# Patient Record
Sex: Male | Born: 1980 | Race: White | Hispanic: No | Marital: Married | Smoking: Current every day smoker
Health system: Southern US, Community
[De-identification: ages and names within clinical notes are randomized; demographics above are authoritative.]

## PROBLEM LIST (undated history)

## (undated) DIAGNOSIS — I10 Essential (primary) hypertension: Secondary | ICD-10-CM

## (undated) DIAGNOSIS — L659 Nonscarring hair loss, unspecified: Secondary | ICD-10-CM

## (undated) HISTORY — DX: Essential (primary) hypertension: I10

## (undated) HISTORY — PX: FINGER SURGERY: SHX640

---

## 2007-03-02 DIAGNOSIS — L659 Nonscarring hair loss, unspecified: Secondary | ICD-10-CM

## 2007-03-02 HISTORY — DX: Nonscarring hair loss, unspecified: L65.9

## 2009-06-05 DIAGNOSIS — Z801 Family history of malignant neoplasm of trachea, bronchus and lung: Secondary | ICD-10-CM | POA: Insufficient documentation

## 2010-02-05 DIAGNOSIS — K219 Gastro-esophageal reflux disease without esophagitis: Secondary | ICD-10-CM | POA: Insufficient documentation

## 2010-08-06 DIAGNOSIS — L659 Nonscarring hair loss, unspecified: Secondary | ICD-10-CM | POA: Insufficient documentation

## 2017-12-27 ENCOUNTER — Ambulatory Visit
Admission: EM | Admit: 2017-12-27 | Discharge: 2017-12-27 | Disposition: A | Discharge status: Home / Self Care | Payer: Self-pay | Attending: Family Medicine | Admitting: Family Medicine

## 2017-12-27 ENCOUNTER — Other Ambulatory Visit: Payer: Self-pay

## 2017-12-27 ENCOUNTER — Ambulatory Visit (INDEPENDENT_AMBULATORY_CARE_PROVIDER_SITE_OTHER): Payer: Self-pay

## 2017-12-27 DIAGNOSIS — S61309A Unspecified open wound of unspecified finger with damage to nail, initial encounter: Secondary | ICD-10-CM

## 2017-12-27 DIAGNOSIS — S61314A Laceration without foreign body of right ring finger with damage to nail, initial encounter: Secondary | ICD-10-CM

## 2017-12-27 DIAGNOSIS — S61319A Laceration without foreign body of unspecified finger with damage to nail, initial encounter: Secondary | ICD-10-CM

## 2017-12-27 DIAGNOSIS — W230XXA Caught, crushed, jammed, or pinched between moving objects, initial encounter: Secondary | ICD-10-CM

## 2017-12-27 DIAGNOSIS — S61304A Unspecified open wound of right ring finger with damage to nail, initial encounter: Secondary | ICD-10-CM

## 2017-12-27 DIAGNOSIS — Z23 Encounter for immunization: Secondary | ICD-10-CM

## 2017-12-27 HISTORY — DX: Nonscarring hair loss, unspecified: L65.9

## 2017-12-27 MED ORDER — DOXYCYCLINE HYCLATE 100 MG PO CAPS: 100.0000 mg | ORAL_CAPSULE | Freq: Two times a day (BID) | ORAL | 0 refills | Status: DC

## 2017-12-27 MED ORDER — TETANUS-DIPHTH-ACELL PERTUSSIS 5-2.5-18.5 LF-MCG/0.5 IM SUSP
0.5000 mL | Freq: Once | INTRAMUSCULAR | Status: AC
  Administered 2017-12-27: 0.5 mL via INTRAMUSCULAR

## 2017-12-27 NOTE — Discharge Instructions (Addendum)
Elevate your hand above the level of your heart most of today and tomorrow to prevent swelling and throbbing.  Keep dry for 24 hours and then you may start a washing program.  Be sure to take all of the antibiotic medications as prescribed.  For pain I recommend Tylenol 500 mg combined with ibuprofen 400 mg every 6 hours.  Return to our clinic in 2 days for follow-up examination.  Because of the injury to the nailbed your nail may not regrow normally.  May take several months for the nail completely regrow.

## 2017-12-27 NOTE — ED Triage Notes (Signed)
Patient complains of right hand ring finger. Patient states that he smashed his finger in between a sliding glass door. Patient states that this occurred around 1 hour ago and has continued to bleed.

## 2017-12-27 NOTE — ED Provider Notes (Signed)
[facility]    CSN: [id number] Arrival date & time: 12/27/17  4098     History   Chief Complaint Chief Complaint  Patient presents with  . Laceration    HPI Rodney Mccoy is a 37 y.o. male.   HPI  37 year old male is with a right dominant hand ring finger crush injury to the distal tip of his finger.  He states he got his finger caught between 2 pains of a sliding glass door.  Approximately 1 hour ago.  Has an injury to the nail itself.        Past Medical History:  Diagnosis Date  . Hair loss     There are no active problems to display for this patient.   Past Surgical History:  Procedure Laterality Date  . FINGER SURGERY         Home Medications    Prior to Admission medications   Medication Sig Start Date End Date Taking? Authorizing Provider  Ascorbic Acid (VITAMIN C) 500 MG CAPS Take by mouth.   Yes [provider]  finasteride (PROSCAR) 5 MG tablet Take by mouth. 05/03/17  Yes [provider]  doxycycline (VIBRAMYCIN) 100 MG capsule Take 1 capsule (100 mg total) by mouth 2 (two) times daily. 12/27/17   Lutricia Feil, PA-C    Family History Family History  Problem Relation Age of Onset  . Hypertension Mother     Social History Social History   Tobacco Use  . Smoking status: Current Every Day Smoker    Packs/day: 0.50    Types: Cigarettes  . Smokeless tobacco: Never Used  Substance Use Topics  . Alcohol use: Yes    Comment: daily  . Drug use: Not Currently     Allergies   Patient has no known allergies.   Review of Systems Review of Systems  Constitutional: Positive for activity change. Negative for appetite change, chills, diaphoresis, fatigue and fever.  Skin: Positive for wound.  All other systems reviewed and are negative.    Physical Exam Triage Vital Signs ED Triage Vitals  Enc Vitals Group     BP 12/27/17 0853 (!) 140/92     Pulse Rate 12/27/17 0853 75     Resp 12/27/17 0853 18   Temp 12/27/17 0853 98.2 F (36.8 C)     Temp Source 12/27/17 0853 Oral     SpO2 12/27/17 0853 100 %     Weight 12/27/17 0850 150 lb (68 kg)     Height 12/27/17 0850 5\' 9"  (1.753 m)     Head Circumference --      Peak Flow --      Pain Score 12/27/17 0850 4     Pain Loc --      Pain Edu? --      Excl. in GC? --    No data found.  Updated Vital Signs BP (!) 140/92 (BP Location: Left Arm)   Pulse 75   Temp 98.2 F (36.8 C) (Oral)   Resp 18   Ht 5\' 9"  (1.753 m)   Wt 150 lb (68 kg)   SpO2 100%   BMI 22.15 kg/m   Visual Acuity Right Eye Distance:   Left Eye Distance:   Bilateral Distance:    Right Eye Near:   Left Eye Near:    Bilateral Near:     Physical Exam  Constitutional: He is oriented to person, place, and time. He appears well-developed and well-nourished. No distress.  HENT:  Head:  Normocephalic.  Eyes: Pupils are equal, round, and reactive to light. Right eye exhibits no discharge. Left eye exhibits no discharge.  Neck: Normal range of motion.  Musculoskeletal: Normal range of motion. He exhibits tenderness and deformity.   Exam Of the right dominant fourth finger laceration of the nail.  No deformity of the finger however the nail is deformed. He has numbness  of the distal tuft.  Extensor tendon is intact and strong to clinical testing by extending the finger and holding against resistance.  No other fingers appear to be involved.  Neurological: He is alert and oriented to person, place, and time.  Skin: Skin is warm and dry. He is not diaphoretic.  Psychiatric: He has a normal mood and affect. His behavior is normal. Thought content normal.  Nursing note and vitals reviewed.        UC Treatments / Results  Labs (all labs ordered are listed, but only abnormal results are displayed) Labs Reviewed - No data to display  EKG None  Radiology Dg Finger Ring Right  Result Date: 12/27/2017 CLINICAL DATA:  Crush injury to tip of right ring finger EXAM:  RIGHT RING FINGER 2+V COMPARISON:  None. FINDINGS: There is no evidence of fracture or dislocation. There is no evidence of arthropathy or other focal bone abnormality. Soft tissues are unremarkable. IMPRESSION: Negative. Electronically Signed   By: Charlett Nose M.D.   On: 12/27/2017 09:42    Procedures Laceration Repair Date/Time: 12/27/2017 11:07 AM Performed by: Lutricia Feil, PA-C Authorized by: Tommie Sams, DO   Consent:    Consent obtained:  Verbal   Consent given by:  Patient   Risks discussed:  Infection, poor cosmetic result and need for additional repair   Alternatives discussed:  Referral Anesthesia (see MAR for exact dosages):    Anesthesia method:  Nerve block   Block needle gauge:  27 G   Block anesthetic:  Lidocaine 1% w/o epi   Block technique:  Digital   Block injection procedure:  Anatomic landmarks identified, introduced needle, negative aspiration for blood and incremental injection   Block outcome:  Anesthesia achieved Laceration details:    Location:  Finger   Finger location:  L ring finger   Length (cm):  2   Depth (mm):  3 Repair type:    Repair type:  Intermediate Pre-procedure details:    Preparation:  Patient was prepped and draped in usual sterile fashion Exploration:    Hemostasis achieved with:  Direct pressure   Wound exploration: entire depth of wound probed and visualized     Contaminated: no   Treatment:    Area cleansed with:  Shur-Clens   Amount of cleaning:  Standard   Irrigation solution:  Sterile saline   Irrigation volume:  60   Irrigation method:  Pressure wash   Visualized foreign bodies/material removed: no   Skin repair:    Repair method:  Sutures and tissue adhesive   Suture size:  5-0   Suture material:  Fast-absorbing gut   Suture technique:  Simple interrupted   Number of sutures:  6 Approximation:    Approximation:  Close Post-procedure details:    Dressing:  Splint for protection and sterile dressing   Patient  tolerance of procedure:  Tolerated well, no immediate complications Comments:     Patient had completely avulsed his nail.  No remnant of nail remained.  Totally lost.  Laceration of the bed is V-shaped partial loss of the ulnar aspect proximally.  Will likely deform the regrowth of the nail.  He was fully advised of this.   fast-absorbing Vicryl was utilized to reapproximate the nailbed laceration.  The bed was then covered with Dermabond protection.  Dry sterile dressing was then applied with nonadherent gauze.  Patient was provided with a stack splint for protection. To be followed in 2 days for wound reevaluation.  Was given doxycycline 5 days a twice daily dosage   (including critical care time)  Medications Ordered in UC Medications  Tdap (BOOSTRIX) injection 0.5 mL (0.5 mLs Intramuscular Given 12/27/17 1103)    Initial Impression / Assessment and Plan / UC Course  I have reviewed the triage vital signs and the nursing notes.  Pertinent labs & imaging results that were available during my care of the patient were reviewed by me and considered in my medical decision making (see chart for details).   Elevate your hand above the level of your heart most of today and tomorrow to prevent swelling and throbbing.  Keep dry for 24 hours and then you may start a washing program.  Be sure to take all of the antibiotic medications as prescribed.  For pain I recommend Tylenol 500 mg combined with ibuprofen 400 mg every 6 hours.  Return to our clinic in 2 days for follow-up examination.  Because of the injury to the nailbed your nail may not regrow normally.  May take several months for the nail completely regrow.     Final Clinical Impressions(s) / UC Diagnoses   Final diagnoses:  Fingernail avulsion, complete, initial encounter  Laceration of nail bed of finger, initial encounter     Discharge Instructions     Elevate your hand above the level of your heart most of today and tomorrow to  prevent swelling and throbbing.  Keep dry for 24 hours and then you may start a washing program.  Be sure to take all of the antibiotic medications as prescribed.  For pain I recommend Tylenol 500 mg combined with ibuprofen 400 mg every 6 hours.  Return to our clinic in 2 days for follow-up examination.  Because of the injury to the nailbed your nail may not regrow normally.  May take several months for the nail completely regrow.    ED Prescriptions    Medication Sig Dispense Auth. Provider   doxycycline (VIBRAMYCIN) 100 MG capsule Take 1 capsule (100 mg total) by mouth 2 (two) times daily. 10 capsule Lutricia Feil, PA-C     Controlled Substance Prescriptions [place] Controlled Substance Registry consulted? Not Applicable   Lutricia Feil, PA-C 12/27/17 1120

## 2017-12-29 ENCOUNTER — Other Ambulatory Visit: Payer: Self-pay

## 2017-12-29 ENCOUNTER — Ambulatory Visit
Admission: EM | Admit: 2017-12-29 | Discharge: 2017-12-29 | Disposition: A | Discharge status: Home / Self Care | Payer: Self-pay | Attending: Family Medicine | Admitting: Family Medicine

## 2017-12-29 DIAGNOSIS — S6710XD Crushing injury of unspecified finger(s), subsequent encounter: Secondary | ICD-10-CM

## 2017-12-29 DIAGNOSIS — S61319D Laceration without foreign body of unspecified finger with damage to nail, subsequent encounter: Secondary | ICD-10-CM

## 2017-12-29 MED ORDER — MUPIROCIN 2 % EX OINT: 1.0000 "application " | TOPICAL_OINTMENT | Freq: Three times a day (TID) | CUTANEOUS | 0 refills | Status: DC

## 2017-12-29 NOTE — ED Triage Notes (Signed)
Patient is here for recheck of fingernail avulsion repair, reports that he has had no issues.

## 2017-12-29 NOTE — Discharge Instructions (Addendum)
1 week after the suturing , start applying Bactroban ointment (mupirocin) 3 times daily until the finger has healed.   If there are any changes that you are unsure of , please return to our clinic for evaluation.

## 2017-12-29 NOTE — ED Provider Notes (Signed)
[facility]    CSN: [id number] Arrival date & time: 12/29/17  4098     History   Chief Complaint Chief Complaint  Patient presents with  . Follow-up  . Finger Injury    HPI Rodney Mccoy is a 37 y.o. male.   HPI  37 year old male presents after sustaining a crush injury to his right dominant ring finger tip with avulsion of the nail and laceration of the nailbed.  He states that he is doing well.  He has had no problems with the repair.  He found the intact nail that had been avulsed on the floor of the patio.         Past Medical History:  Diagnosis Date  . Hair loss     There are no active problems to display for this patient.   Past Surgical History:  Procedure Laterality Date  . FINGER SURGERY         Home Medications    Prior to Admission medications   Medication Sig Start Date End Date Taking? Authorizing Provider  Ascorbic Acid (VITAMIN C) 500 MG CAPS Take by mouth.   Yes [provider]  doxycycline (VIBRAMYCIN) 100 MG capsule Take 1 capsule (100 mg total) by mouth 2 (two) times daily. 12/27/17  Yes Lutricia Feil, PA-C  finasteride (PROSCAR) 5 MG tablet Take by mouth. 05/03/17  Yes [provider]  mupirocin ointment (BACTROBAN) 2 % Apply 1 application topically 3 (three) times daily. 12/29/17   Lutricia Feil, PA-C    Family History Family History  Problem Relation Age of Onset  . Hypertension Mother     Social History Social History   Tobacco Use  . Smoking status: Current Every Day Smoker    Packs/day: 0.50    Types: Cigarettes  . Smokeless tobacco: Never Used  Substance Use Topics  . Alcohol use: Yes    Comment: daily  . Drug use: Not Currently     Allergies   Patient has no known allergies.   Review of Systems Review of Systems  Constitutional: Positive for activity change. Negative for appetite change, chills, fatigue and fever.  All other systems reviewed and are  negative.    Physical Exam Triage Vital Signs ED Triage Vitals  Enc Vitals Group     BP 12/29/17 0926 (!) 135/98     Pulse Rate 12/29/17 0926 76     Resp 12/29/17 0926 18     Temp 12/29/17 0926 98.1 F (36.7 C)     Temp Source 12/29/17 0926 Oral     SpO2 12/29/17 0926 100 %     Weight 12/29/17 0925 150 lb (68 kg)     Height 12/29/17 0925 5\' 9"  (1.753 m)     Head Circumference --      Peak Flow --      Pain Score 12/29/17 0925 2     Pain Loc --      Pain Edu? --      Excl. in GC? --    No data found.  Updated Vital Signs BP (!) 135/98 (BP Location: Left Arm)   Pulse 76   Temp 98.1 F (36.7 C) (Oral)   Resp 18   Ht 5\' 9"  (1.753 m)   Wt 150 lb (68 kg)   SpO2 100%   BMI 22.15 kg/m   Visual Acuity Right Eye Distance:   Left Eye Distance:   Bilateral Distance:    Right Eye Near:   Left Eye  Near:    Bilateral Near:     Physical Exam  Constitutional: He is oriented to person, place, and time. He appears well-developed and well-nourished. No distress.  HENT:  Head: Normocephalic.  Neck: Normal range of motion.  Musculoskeletal: Normal range of motion.  Neurological: He is alert and oriented to person, place, and time.  Skin: Skin is warm and dry. He is not diaphoretic.  Refer to pictures for detail.  Psychiatric: He has a normal mood and affect. His behavior is normal. Judgment and thought content normal.  Nursing note and vitals reviewed.          UC Treatments / Results  Labs (all labs ordered are listed, but only abnormal results are displayed) Labs Reviewed - No data to display  EKG None  Radiology No results found.  Procedures Procedures (including critical care time)  Medications Ordered in UC Medications - No data to display  Initial Impression / Assessment and Plan / UC Course  I have reviewed the triage vital signs and the nursing notes.  Pertinent labs & imaging results that were available during my care of the patient were  reviewed by me and considered in my medical decision making (see chart for details).      Told the Patient that the wound appears to be   Healing well.  I recommended that 1 week after the suturing that he begin applying Bactroban ointment until the wound is healed.  I have told him that it would take several months for the nail to regrow and it may have a defect due to the loss of the nailbed matrix ulnarly.  If he has any changes or is uncertain about any signs or symptoms of infection he should return to our clinic. Final Clinical Impressions(s) / UC Diagnoses   Final diagnoses:  Crushing injury of finger, subsequent encounter  Nailbed laceration, finger, subsequent encounter     Discharge Instructions     1 week after the suturing , start applying Bactroban ointment (mupirocin) 3 times daily until the finger has healed.   If there are any changes that you are unsure of , please return to our clinic for evaluation.    ED Prescriptions    Medication Sig Dispense Auth. Provider   mupirocin ointment (BACTROBAN) 2 % Apply 1 application topically 3 (three) times daily. 22 g Lutricia Feil, PA-C     Controlled Substance Prescriptions [place] Controlled Substance Registry consulted? Not Applicable   Lutricia Feil, PA-C 12/29/17 8469

## 2019-06-24 IMAGING — CR DG FINGER RING 2+V*R*
3 series · 3 of 3 positions shown · non-contrast
Comparison: None.

CLINICAL DATA: Crush injury to tip of right ring finger

EXAM:
RIGHT RING FINGER 2+V

[finger ap]
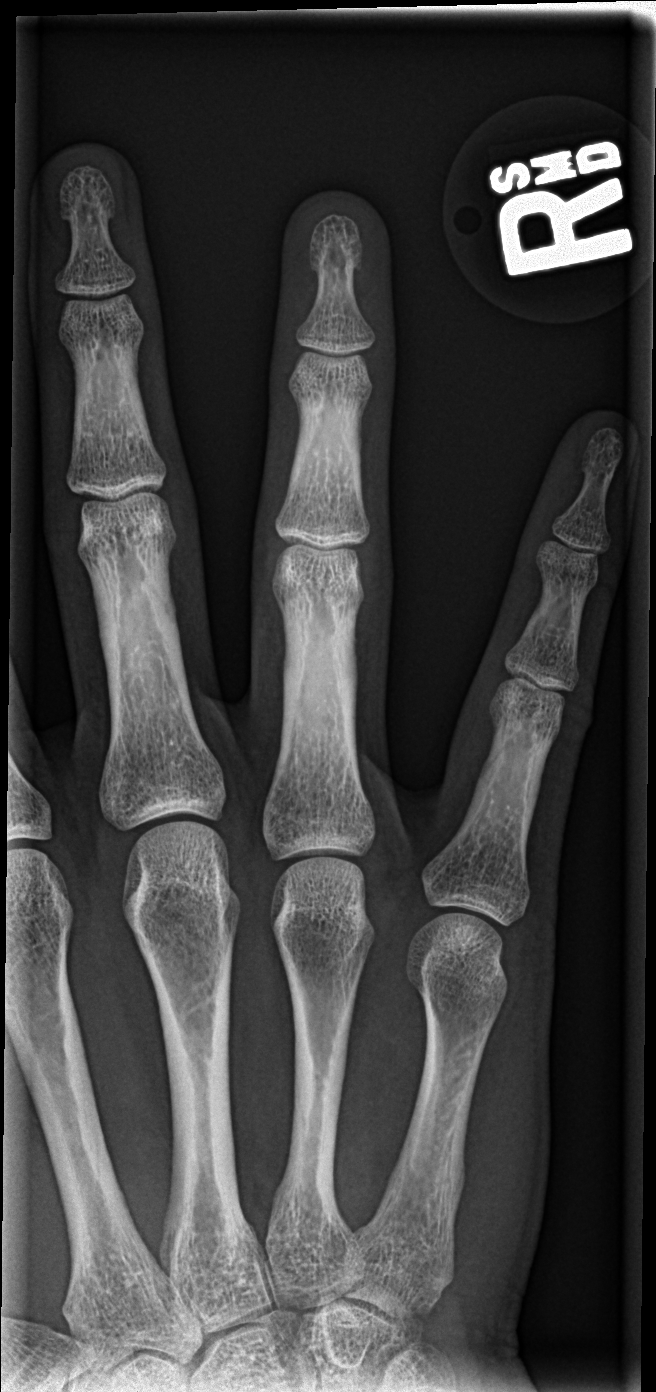

[finger obl]
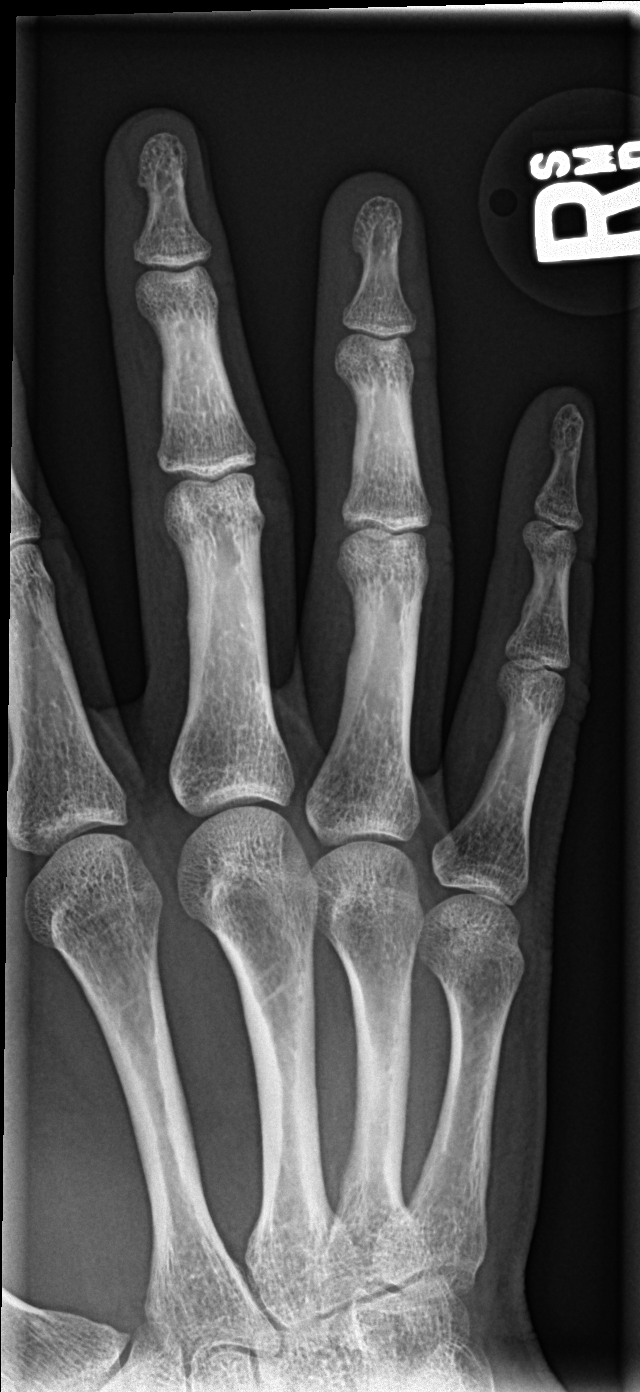

[finger lat]
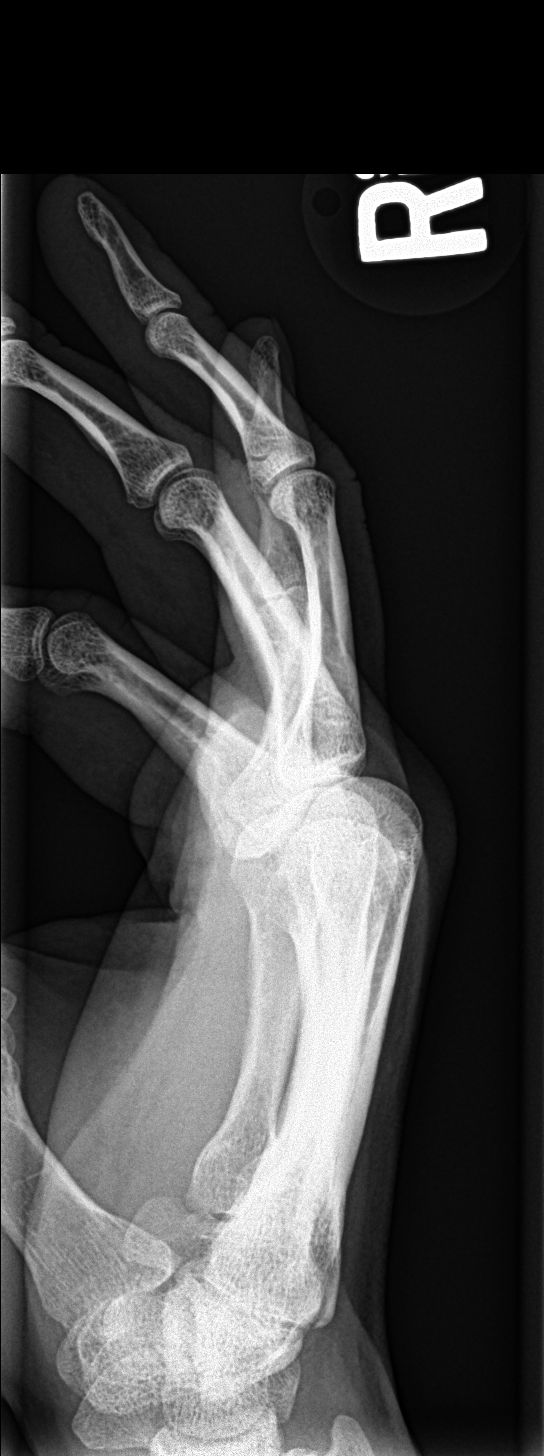

[3 of 3 positions shown; findings below may reference images not displayed]

FINDINGS: There is no evidence of fracture or dislocation. There is no
evidence of arthropathy or other focal bone abnormality. Soft
tissues are unremarkable.
IMPRESSION: Negative.

## 2020-01-31 ENCOUNTER — Other Ambulatory Visit: Payer: Self-pay | Admitting: Obstetrics and Gynecology

## 2020-01-31 DIAGNOSIS — Z319 Encounter for procreative management, unspecified: Secondary | ICD-10-CM

## 2021-05-13 ENCOUNTER — Encounter: Payer: Self-pay | Admitting: Family Medicine

## 2021-05-13 ENCOUNTER — Ambulatory Visit: Payer: BC Managed Care – PPO | Admitting: Family Medicine

## 2021-05-13 ENCOUNTER — Other Ambulatory Visit: Payer: Self-pay

## 2021-05-13 VITALS — BP 148/90 | HR 74 | Ht 69.0 in | Wt 157.0 lb

## 2021-05-13 DIAGNOSIS — Z0000 Encounter for general adult medical examination without abnormal findings: Secondary | ICD-10-CM | POA: Insufficient documentation

## 2021-05-13 DIAGNOSIS — Z1159 Encounter for screening for other viral diseases: Secondary | ICD-10-CM

## 2021-05-13 DIAGNOSIS — B351 Tinea unguium: Secondary | ICD-10-CM

## 2021-05-13 DIAGNOSIS — B353 Tinea pedis: Secondary | ICD-10-CM | POA: Insufficient documentation

## 2021-05-13 DIAGNOSIS — Z13220 Encounter for screening for lipoid disorders: Secondary | ICD-10-CM | POA: Diagnosis not present

## 2021-05-13 DIAGNOSIS — Z114 Encounter for screening for human immunodeficiency virus [HIV]: Secondary | ICD-10-CM

## 2021-05-13 DIAGNOSIS — Z720 Tobacco use: Secondary | ICD-10-CM | POA: Insufficient documentation

## 2021-05-13 DIAGNOSIS — R7989 Other specified abnormal findings of blood chemistry: Secondary | ICD-10-CM

## 2021-05-13 DIAGNOSIS — L659 Nonscarring hair loss, unspecified: Secondary | ICD-10-CM

## 2021-05-13 MED ORDER — FINASTERIDE 5 MG PO TABS: 5.0000 mg | ORAL_TABLET | Freq: Every day | ORAL | 0 refills | Status: DC

## 2021-05-13 NOTE — Progress Notes (Signed)
?  ? ?Annual Physical Exam Visit ? ?Patient Information:  ?Patient ID: Rodney Mccoy, male DOB: 07/20/1980 Age: 41 y.o. MRN: [id number]  ? ?Subjective:  ? ?CC: Annual Physical Exam ? ?HPI:  ?Rodney Mccoy is here for their annual physical. ? ?I reviewed the past medical history, family history, social history, surgical history, and allergies today and changes were made as necessary.  Please see the problem list section below for additional details. ? ?Past Medical History: ?Past Medical History:  ?Diagnosis Date  ? Hair loss 2009  ? ?Past Surgical History: ?Past Surgical History:  ?Procedure Laterality Date  ? FINGER SURGERY Bilateral   ? x 3, 2003 (right 3rd), 2006 (left 4th), 2019 (right 4th)  ? ?Family History: ?Family History  ?Problem Relation Age of Onset  ? Hypertension Mother   ? Cancer Maternal Grandfather   ?     prostate cancer (dx 69)  ? Heart disease Maternal Grandfather   ?     required double bypass (dx 78)  ? Cancer Paternal Grandmother   ?     GI cancer (dx 24s)  ? Cancer Paternal Grandfather   ?     GI and skin cancer (melanoma) (dx early-mid 80s)  ? ?Allergies: ?Allergies  ?Allergen Reactions  ? No Allergies On File   ? ?Health Maintenance: ?Health Maintenance  ?Topic Date Due  ? Hepatitis C Screening  Never done  ? COVID-19 Vaccine (5 - Booster) 03/28/2020  ? TETANUS/TDAP  12/28/2027  ? INFLUENZA VACCINE  Completed  ? HIV Screening  Completed  ? HPV VACCINES  Aged Out  ?  ?HM Colonoscopy   ? ? This patient has no relevant Health Maintenance data.  ? ?  ? ?Medications: ?Current Outpatient Medications on File Prior to Visit  ?Medication Sig Dispense Refill  ? Ascorbic Acid (VITAMIN C) 500 MG CAPS Take by mouth.    ? ?No current facility-administered medications on file prior to visit.  ? ? ?Review of Systems: No headache, visual changes, nausea, vomiting, diarrhea, constipation, dizziness, abdominal pain, skin rash, fevers, chills, night sweats, swollen lymph nodes, weight loss, chest pain, body  aches, joint swelling, muscle aches, shortness of breath, mood changes, visual or auditory hallucinations reported. ? ?Objective:  ? ?Vitals:  ? 05/13/21 0802  ?BP: (!) 148/90  ?Pulse: 74  ?SpO2: 98%  ? ?Vitals:  ? 05/13/21 0802  ?Weight: 157 lb (71.2 kg)  ?Height: 5\' 9"  (1.753 m)  ? ?Body mass index is 23.18 kg/m?. ? ?General: Well Developed, well nourished, and in no acute distress.  ?Neuro: Alert and oriented x3, extra-ocular muscles intact, sensation grossly intact. Cranial nerves II through XII are grossly intact, motor, sensory, and coordinative functions are intact. ?HEENT: Normocephalic, atraumatic, pupils equal round reactive to light, neck supple, no masses, no lymphadenopathy, thyroid nonpalpable. Oropharynx, nasopharynx, external ear canals are unremarkable. ?Skin: Warm and dry, no rashes noted.  Onychomycosis noted bilaterally, between fourth and fifth right toes there is evidence of tinea pedis, macerated appearance of skin noted as well ?Cardiac: Regular rate and rhythm, no murmurs rubs or gallops. No peripheral edema. Pulses symmetric. ?Respiratory: Clear to auscultation bilaterally. Not using accessory muscles, speaking in full sentences.  ?Abdominal: Soft, nontender, nondistended, positive bowel sounds, no masses, no organomegaly. ?Musculoskeletal: Shoulder, elbow, wrist, hip, knee, ankle stable, and with full range of motion. ? ?Impression and Recommendations:  ? ?The patient was counselled, risk factors were discussed, and anticipatory guidance given. ? ?Alopecia ?Tolerating finasteride well at a dose of  half a tablet every other day.  Refilled #90 today. ? ?Onychomycosis ?Incidentally noted bilateral, is interested in management later in the year, offered podiatry referral, will contact us when interested in pursuing this. ? 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  Treatments offered in addition to  podiatry referral, patient has opted to pursue this later this year, will contact us when interested in referral/treatment. ? ?Annual physical exam ?Annual examination completed, risk stratification labs ordered, anticipatory guidance provided.  We will follow labs once resulted.  Incidentally noted elevated blood pressure today in the setting of tobacco abuse, counseling provided.  Risk stratification labs will be reviewed and he will return in 1 month for BP recheck in light of new labs. ? ?Tobacco abuse ?Chronic condition, not at this stage where he is ready to quit.  We discussed cessation x 4 minutes, overarching goals, relation of tobacco abuse to hypertension and other medical comorbidities.  He will contact us if interested in tobacco cessation. ? ?Orders & Medications ?Medications:  ?Meds ordered this encounter  ?Medications  ? finasteride (PROSCAR) 5 MG tablet  ?  Sig: Take 1 tablet (5 mg total) by mouth daily.  ?  Dispense:  90 tablet  ?  Refill:  0  ? ?Orders Placed This Encounter  ?Procedures  ? Apo A1 + B + Ratio  ? CBC  ? Comprehensive metabolic panel  ? Hepatitis C antibody  ? HIV Antibody (routine testing w rflx)  ? TSH  ? Lipid panel  ? VITAMIN D 25 Hydroxy (Vit-D Deficiency, Fractures)  ? HBsAb Quant HBIG Assessment  ?  ? ?Return in about 4 weeks (around 06/10/2021).  ? ? ?Jerrol Banana, MD ? ? Primary Care Sports Medicine ?[facility] ?[facility]  ? ?

## 2021-05-13 NOTE — Assessment & Plan Note (Signed)
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  Treatments offered in addition to podiatry referral, patient has opted to pursue this later this year, will contact us when interested in referral/treatment. ?

## 2021-05-13 NOTE — Assessment & Plan Note (Signed)
Incidentally noted bilateral, is interested in management later in the year, offered podiatry referral, will contact us when interested in pursuing this. ?

## 2021-05-13 NOTE — Patient Instructions (Signed)
-   Obtain fasting labs with orders provided (can have water or black coffee but otherwise no food or drink x 8 hours before labs) - Review information provided - Attend eye doctor annually, dentist every 6 months, work towards or maintain 30 minutes of moderate intensity physical activity at least 5 days per week, and consume a balanced diet - Return in 1 month for follow-up - Contact us for any questions between now and then 

## 2021-05-13 NOTE — Assessment & Plan Note (Signed)
Annual examination completed, risk stratification labs ordered, anticipatory guidance provided.  We will follow labs once resulted.  Incidentally noted elevated blood pressure today in the setting of tobacco abuse, counseling provided.  Risk stratification labs will be reviewed and he will return in 1 month for BP recheck in light of new labs. ?

## 2021-05-13 NOTE — Assessment & Plan Note (Signed)
Tolerating finasteride well at a dose of half a tablet every other day.  Refilled #90 today. ?

## 2021-05-13 NOTE — Assessment & Plan Note (Addendum)
Chronic condition, not at this stage where he is ready to quit.  We discussed cessation x 4 minutes, overarching goals, relation of tobacco abuse to hypertension and other medical comorbidities.  He will contact us if interested in tobacco cessation. ?

## 2021-05-19 DIAGNOSIS — Z1159 Encounter for screening for other viral diseases: Secondary | ICD-10-CM | POA: Diagnosis not present

## 2021-05-19 DIAGNOSIS — Z114 Encounter for screening for human immunodeficiency virus [HIV]: Secondary | ICD-10-CM | POA: Diagnosis not present

## 2021-05-19 DIAGNOSIS — Z13220 Encounter for screening for lipoid disorders: Secondary | ICD-10-CM | POA: Diagnosis not present

## 2021-05-19 DIAGNOSIS — R7989 Other specified abnormal findings of blood chemistry: Secondary | ICD-10-CM | POA: Diagnosis not present

## 2021-05-19 DIAGNOSIS — Z0000 Encounter for general adult medical examination without abnormal findings: Secondary | ICD-10-CM | POA: Diagnosis not present

## 2021-05-20 LAB — COMPREHENSIVE METABOLIC PANEL
ALT: 16 IU/L (ref 0–44)
AST: 18 IU/L (ref 0–40)
Albumin/Globulin Ratio: 2.3 — ABNORMAL HIGH (ref 1.2–2.2)
Albumin: 4.8 g/dL (ref 4.0–5.0)
Alkaline Phosphatase: 87 IU/L (ref 44–121)
BUN/Creatinine Ratio: 10 (ref 9–20)
BUN: 9 mg/dL (ref 6–20)
Bilirubin Total: 0.8 mg/dL (ref 0.0–1.2)
CO2: 23 mmol/L (ref 20–29)
Calcium: 9.4 mg/dL (ref 8.7–10.2)
Chloride: 103 mmol/L (ref 96–106)
Creatinine, Ser: 0.93 mg/dL (ref 0.76–1.27)
Globulin, Total: 2.1 g/dL (ref 1.5–4.5)
Glucose: 88 mg/dL (ref 70–99)
Potassium: 4 mmol/L (ref 3.5–5.2)
Sodium: 141 mmol/L (ref 134–144)
Total Protein: 6.9 g/dL (ref 6.0–8.5)
eGFR: 107 mL/min/{1.73_m2} (ref >59)

## 2021-05-20 LAB — CBC
Hematocrit: 48.6 % (ref 37.5–51.0)
Hemoglobin: 16.3 g/dL (ref 13.0–17.7)
MCH: 31.2 pg (ref 26.6–33.0)
MCHC: 33.5 g/dL (ref 31.5–35.7)
MCV: 93 fL (ref 79–97)
Platelets: 187 10*3/uL (ref 150–450)
RBC: 5.22 x10E6/uL (ref 4.14–5.80)
RDW: 12.1 % (ref 11.6–15.4)
WBC: 6.9 10*3/uL (ref 3.4–10.8)

## 2021-05-20 LAB — VITAMIN D 25 HYDROXY (VIT D DEFICIENCY, FRACTURES): Vit D, 25-Hydroxy: 22.4 ng/mL — ABNORMAL LOW (ref 30.0–100.0)

## 2021-05-20 LAB — HBSAB QUANT HBIG ASSESSMENT: HBsAb Quant HBIG Assessment: 3.9 m[IU]/mL

## 2021-05-20 LAB — LIPID PANEL
Chol/HDL Ratio: 3.2 ratio (ref 0.0–5.0)
Cholesterol, Total: 213 mg/dL — ABNORMAL HIGH (ref 100–199)
HDL: 66 mg/dL (ref >39)
LDL Chol Calc (NIH): 132 mg/dL — ABNORMAL HIGH (ref 0–99)
Triglycerides: 86 mg/dL (ref 0–149)
VLDL Cholesterol Cal: 15 mg/dL (ref 5–40)

## 2021-05-20 LAB — HEPATITIS C ANTIBODY: Hep C Virus Ab: NONREACTIVE

## 2021-05-20 LAB — TSH: TSH: 1.14 u[IU]/mL (ref 0.450–4.500)

## 2021-05-20 LAB — APO A1 + B + RATIO
Apolipo. B/A-1 Ratio: 0.6 ratio (ref 0.0–0.7)
Apolipoprotein A-1: 170 mg/dL (ref 101–178)
Apolipoprotein B: 108 mg/dL — ABNORMAL HIGH (ref <90)

## 2021-05-20 LAB — HIV ANTIBODY (ROUTINE TESTING W REFLEX): HIV Screen 4th Generation wRfx: NONREACTIVE

## 2021-05-25 ENCOUNTER — Other Ambulatory Visit: Payer: Self-pay | Admitting: Family Medicine

## 2021-05-25 DIAGNOSIS — R7989 Other specified abnormal findings of blood chemistry: Secondary | ICD-10-CM

## 2021-05-25 MED ORDER — VITAMIN D (ERGOCALCIFEROL) 1.25 MG (50000 UNIT) PO CAPS: 50000.0000 [IU] | ORAL_CAPSULE | ORAL | 0 refills | Status: DC

## 2021-06-18 ENCOUNTER — Encounter: Payer: Self-pay | Admitting: Family Medicine

## 2021-06-18 ENCOUNTER — Ambulatory Visit: Payer: BC Managed Care – PPO | Admitting: Family Medicine

## 2021-06-18 VITALS — BP 142/100 | HR 80 | Ht 69.0 in | Wt 152.6 lb

## 2021-06-18 DIAGNOSIS — E782 Mixed hyperlipidemia: Secondary | ICD-10-CM | POA: Diagnosis not present

## 2021-06-18 DIAGNOSIS — Z720 Tobacco use: Secondary | ICD-10-CM | POA: Diagnosis not present

## 2021-06-18 DIAGNOSIS — I10 Essential (primary) hypertension: Secondary | ICD-10-CM | POA: Insufficient documentation

## 2021-06-18 MED ORDER — LISINOPRIL 10 MG PO TABS: 10.0000 mg | ORAL_TABLET | Freq: Every day | ORAL | 3 refills | Status: DC

## 2021-06-18 NOTE — Progress Notes (Signed)
?  ? ?  Primary Care / Sports Medicine Office Visit ? ?Patient Information:  ?Patient ID: Rodney Mccoy, male DOB: 1980-04-20 Age: 41 y.o. MRN: [id number]  ? ?Rodney Mccoy is a pleasant 41 y.o. male presenting with the following: ? ?Chief Complaint  ?Patient presents with  ? Follow-up  ?  Alopecia-tolerateing medicaiton well ?Onychomycosis and Tinea pedis of right foot- has not seen podiatry yet will if need too.  ?Has not quit smoking ?Blood pressure- 140/90ish at home  ? ? ?Vitals:  ? 06/18/21 0808  ?BP: (!) 142/100  ?Pulse: 80  ?SpO2: 98%  ? ?Vitals:  ? 06/18/21 0808  ?Weight: 152 lb 9.6 oz (69.2 kg)  ?Height: 5\' 9"  (1.753 m)  ? ?Body mass index is 22.54 kg/m?. ? ?No results found.  ? ?Independent interpretation of notes and tests performed by another provider:  ? ?None ? ?Procedures performed:  ? ?None ? ?Pertinent History, Exam, Impression, and Recommendations:  ? ?Problem List Items Addressed This Visit   ? ?  ? Cardiovascular and Mediastinum  ? Essential hypertension - Primary  ?  Patient with serial visits with elevated systolic and diastolic blood pressure readings, he is asymptomatic from a cardiopulmonary standpoint.  Given the chronic nature of this condition, ongoing nature, comorbid risk factors (tobacco use, hyperlipidemia), we discussed modifying risk factors and he is amenable to initiation of lisinopril daily.  We will start at 10 mg and have the patient return in 1 month for review of symptoms.  Today his examination reveals benign cardiopulmonary findings.  I have also encouraged patient to look into nonpharmacologic stress management, tobacco cessation, and dietary changes. ? ?Chronic condition, uncontrolled, Rx management ? ?  ?  ? Relevant Medications  ? lisinopril (ZESTRIL) 10 MG tablet  ?  ? Other  ? Tobacco abuse  ?  Patient with longstanding tobacco use history, this in the setting of heightened stress (88-month-old baby) and hypertension.  We spent roughly 3-4 minutes discussing the nature  of tobacco use and its relation to his ongoing hypertension, underlying etiology of tobacco abuse, and treatment strategies.  Information was also provided to the patient via MyChart.  He is to review this information and I encouraged the patient to contact 8-month if interested in tobacco cessation, Wellbutrin as a cessation aid to be considered. ? ?  ?  ? Mixed hyperlipidemia  ?  Noted on recent annual labs in the setting of hypertension and tobacco use.  At this stage the focus is on dietary and lifestyle modifications.  Information was provided to the patient in that regard today.  Plan to recheck labs at regular interval. ? ?  ?  ? Relevant Medications  ? lisinopril (ZESTRIL) 10 MG tablet  ?  ? ?Orders & Medications ?Meds ordered this encounter  ?Medications  ? lisinopril (ZESTRIL) 10 MG tablet  ?  Sig: Take 1 tablet (10 mg total) by mouth daily.  ?  Dispense:  30 tablet  ?  Refill:  3  ? ?No orders of the defined types were placed in this encounter. ?  ? ?Return in about 4 weeks (around 07/16/2021).  ?  ? ?07/18/2021, MD ? ? Primary Care Sports Medicine ?[facility] ?[facility]  ? ?

## 2021-06-18 NOTE — Patient Instructions (Signed)
-   Start lisinopril daily ?- Review information provided regarding healthy lifestyle changes ?- Return for follow-up in 4 weeks, contact our office for any questions between now and then ?

## 2021-06-18 NOTE — Assessment & Plan Note (Signed)
Patient with longstanding tobacco use history, this in the setting of heightened stress (48-month-old baby) and hypertension.  We spent roughly 3-4 minutes discussing the nature of tobacco use and its relation to his ongoing hypertension, underlying etiology of tobacco abuse, and treatment strategies.  Information was also provided to the patient via MyChart.  He is to review this information and I encouraged the patient to contact us if interested in tobacco cessation, Wellbutrin as a cessation aid to be considered. ?

## 2021-06-18 NOTE — Assessment & Plan Note (Signed)
Patient with serial visits with elevated systolic and diastolic blood pressure readings, he is asymptomatic from a cardiopulmonary standpoint.  Given the chronic nature of this condition, ongoing nature, comorbid risk factors (tobacco use, hyperlipidemia), we discussed modifying risk factors and he is amenable to initiation of lisinopril daily.  We will start at 10 mg and have the patient return in 1 month for review of symptoms.  Today his examination reveals benign cardiopulmonary findings.  I have also encouraged patient to look into nonpharmacologic stress management, tobacco cessation, and dietary changes. ? ?Chronic condition, uncontrolled, Rx management ?

## 2021-06-18 NOTE — Assessment & Plan Note (Signed)
Noted on recent annual labs in the setting of hypertension and tobacco use.  At this stage the focus is on dietary and lifestyle modifications.  Information was provided to the patient in that regard today.  Plan to recheck labs at regular interval. ?

## 2021-07-20 ENCOUNTER — Encounter: Payer: Self-pay | Admitting: Family Medicine

## 2021-07-20 ENCOUNTER — Ambulatory Visit: Payer: BC Managed Care – PPO | Admitting: Family Medicine

## 2021-07-20 DIAGNOSIS — I10 Essential (primary) hypertension: Secondary | ICD-10-CM

## 2021-07-20 MED ORDER — LISINOPRIL 10 MG PO TABS: 10.0000 mg | ORAL_TABLET | Freq: Every day | ORAL | 0 refills | Status: DC

## 2021-07-20 NOTE — Assessment & Plan Note (Signed)
Patient returns for hypertension follow-up, has been tolerating lisinopril 10 mg well without adverse effects, if anything reports increased energy.  He does not bring up any cardiopulmonary issues.  Blood pressure in normal range today.  We have reviewed additional options such as maintaining current dose, evaluating for further risk factors such as the possibility of OSA via home sleep study, and mood component.  The states she has opted to proceed with lisinopril 10 mg until his return in 3 months.  He is to reach out for any questions/concerns between now and then.

## 2021-07-20 NOTE — Progress Notes (Signed)
     Primary Care / Sports Medicine Office Visit  Patient Information:  Patient ID: Rodney Mccoy, male DOB: 12-20-1980 Age: 41 y.o. MRN: [id number]   Rodney Mccoy is a pleasant 41 y.o. male presenting with the following:  Chief Complaint  Patient presents with   Hypertension    Tolerating medication ok, feels better, less tired.     Vitals:   07/20/21 0759  BP: 132/86  Pulse: 67  SpO2: 98%   Vitals:   07/20/21 0759  Weight: 153 lb 9.6 oz (69.7 kg)  Height: 5\' 9"  (1.753 m)   Body mass index is 22.68 kg/m.  No results found.   Independent interpretation of notes and tests performed by another provider:   None  Procedures performed:   None  Pertinent History, Exam, Impression, and Recommendations:   Problem List Items Addressed This Visit       Cardiovascular and Mediastinum   Essential hypertension    Patient returns for hypertension follow-up, has been tolerating lisinopril 10 mg well without adverse effects, if anything reports increased energy.  He does not bring up any cardiopulmonary issues.  Blood pressure in normal range today.  We have reviewed additional options such as maintaining current dose, evaluating for further risk factors such as the possibility of OSA via home sleep study, and mood component.  The states she has opted to proceed with lisinopril 10 mg until his return in 3 months.  He is to reach out for any questions/concerns between now and then.       Relevant Medications   lisinopril (ZESTRIL) 10 MG tablet     Orders & Medications Meds ordered this encounter  Medications   lisinopril (ZESTRIL) 10 MG tablet    Sig: Take 1 tablet (10 mg total) by mouth daily.    Dispense:  90 tablet    Refill:  0   No orders of the defined types were placed in this encounter.    Return in about 3 months (around 10/20/2021).     10/22/2021, MD   Primary Care Sports Medicine [facility]

## 2021-07-20 NOTE — Patient Instructions (Signed)
-   Continue current dose of lisinopril and healthy lifestyle changes - Return for follow-up in 3 months - Contact us for any questions/concerns between now and then

## 2021-10-15 ENCOUNTER — Other Ambulatory Visit: Payer: Self-pay | Admitting: Family Medicine

## 2021-10-15 DIAGNOSIS — I10 Essential (primary) hypertension: Secondary | ICD-10-CM

## 2021-10-15 NOTE — Telephone Encounter (Signed)
Requested medication (s) are due for refill today: yes  Requested medication (s) are on the active medication list: yes  Last refill:  07/20/21 #90 0 refills  Future visit scheduled: yes in 5 days  Notes to clinic:  no remaining refills. Do you want to refill Rx?     Requested Prescriptions  Pending Prescriptions Disp Refills   lisinopril (ZESTRIL) 10 MG tablet [Pharmacy Med Name: LISINOPRIL 10MG  TABLETS] 90 tablet 0    Sig: TAKE 1 TABLET(10 MG) BY MOUTH DAILY     Cardiovascular:  ACE Inhibitors Passed - 10/15/2021  3:34 AM      Passed - Cr in normal range and within 180 days    Creatinine, Ser  Date Value Ref Range Status  05/19/2021 0.93 0.76 - 1.27 mg/dL Final         Passed - K in normal range and within 180 days    Potassium  Date Value Ref Range Status  05/19/2021 4.0 3.5 - 5.2 mmol/L Final         Passed - Patient is not pregnant      Passed - Last BP in normal range    BP Readings from Last 1 Encounters:  07/20/21 132/86         Passed - Valid encounter within last 6 months    Recent Outpatient Visits           2 months ago Essential hypertension   [place] [facility] 07/22/21, Emelia Loron, MD   3 months ago Essential hypertension   [place] [facility] Ocie Bob, Emelia Loron, MD   5 months ago Annual physical exam   [facility] at [facility], [facility], MD       Future Appointments             In 5 days Ocie Bob, Ashley Royalty, MD [facility] Primary Care and Sports Medicine at [facility], [facility]

## 2021-10-20 ENCOUNTER — Ambulatory Visit: Payer: BC Managed Care – PPO | Admitting: Family Medicine

## 2021-10-20 ENCOUNTER — Encounter: Payer: Self-pay | Admitting: Family Medicine

## 2021-10-20 VITALS — BP 128/82 | HR 71 | Ht 69.0 in | Wt 146.6 lb

## 2021-10-20 DIAGNOSIS — Z720 Tobacco use: Secondary | ICD-10-CM | POA: Diagnosis not present

## 2021-10-20 DIAGNOSIS — I10 Essential (primary) hypertension: Secondary | ICD-10-CM | POA: Diagnosis not present

## 2021-10-20 DIAGNOSIS — L659 Nonscarring hair loss, unspecified: Secondary | ICD-10-CM | POA: Diagnosis not present

## 2021-10-20 MED ORDER — FINASTERIDE 5 MG PO TABS: 5.0000 mg | ORAL_TABLET | Freq: Every day | ORAL | 2 refills | Status: DC

## 2021-10-20 MED ORDER — LISINOPRIL 10 MG PO TABS: ORAL_TABLET | ORAL | 2 refills | Status: DC

## 2021-10-20 NOTE — Patient Instructions (Signed)
-   Continue your excellent healthy lifestyle changes - Can contact for any smoking cessation assistance - Return for physical in 05/2021

## 2021-10-20 NOTE — Assessment & Plan Note (Signed)
Chronic, well-controlled, tolerating medication well without issue - medications refilled.

## 2021-10-20 NOTE — Assessment & Plan Note (Signed)
Excellent sequential decrease in BP reading over serial visits. He is tolerating the lisinopril 10 mg well and he has made excellent lifestyle changes (primarilly dietary) which have coincided with noted weight loss. Discussed lisinopril and we will continued at current dose which is working well for him.

## 2021-10-20 NOTE — Assessment & Plan Note (Signed)
Discussed smoking cessation in light of relation to concomitant hypertension, medication-based methods for cessation touched on today and he was encouraged to reach out anytime he may wish to proceed with the same.

## 2021-10-20 NOTE — Progress Notes (Signed)
     Primary Care / Sports Medicine Office Visit  Patient Information:  Patient ID: Rodney Mccoy, male DOB: September 21, 1980 Age: 41 y.o. MRN: [id number]   [name] is a pleasant 41 y.o. male presenting with the following:  Chief Complaint  Patient presents with   Hypertension    Vitals:   10/20/21 0759  BP: 128/82  Pulse: 71  SpO2: 98%   Vitals:   10/20/21 0759  Weight: 146 lb 9.6 oz (66.5 kg)  Height: 5\' 9"  (1.753 m)   Body mass index is 21.65 kg/m.  No results found.   Independent interpretation of notes and tests performed by another provider:   None  Procedures performed:   None  Pertinent History, Exam, Impression, and Recommendations:   Problem List Items Addressed This Visit       Cardiovascular and Mediastinum   Essential hypertension    Excellent sequential decrease in BP reading over serial visits. He is tolerating the lisinopril 10 mg well and he has made excellent lifestyle changes (primarilly dietary) which have coincided with noted weight loss. Discussed lisinopril and we will continued at current dose which is working well for him.      Relevant Medications   lisinopril (ZESTRIL) 10 MG tablet     Musculoskeletal and Integument   Alopecia    Chronic, well-controlled, tolerating medication well without issue - medications refilled.      Relevant Medications   finasteride (PROSCAR) 5 MG tablet     Other   Tobacco abuse - Primary    Discussed smoking cessation in light of relation to concomitant hypertension, medication-based methods for cessation touched on today and he was encouraged to reach out anytime he may wish to proceed with the same.        Orders & Medications Meds ordered this encounter  Medications   lisinopril (ZESTRIL) 10 MG tablet    Sig: TAKE 1 TABLET(10 MG) BY MOUTH DAILY    Dispense:  90 tablet    Refill:  2   finasteride (PROSCAR) 5 MG tablet    Sig: Take 1 tablet (5 mg total) by mouth daily.    Dispense:  90  tablet    Refill:  2   No orders of the defined types were placed in this encounter.    Return in about 8 months (around 06/21/2022) for Annual Physical.     06/23/2022, MD   Primary Care Sports Medicine [facility]

## 2021-11-23 ENCOUNTER — Other Ambulatory Visit: Payer: Self-pay | Admitting: Family Medicine

## 2021-11-23 DIAGNOSIS — I10 Essential (primary) hypertension: Secondary | ICD-10-CM

## 2021-11-23 NOTE — Telephone Encounter (Signed)
last RF 10/20/21 #90 2 RF   Requested Prescriptions  Refused Prescriptions Disp Refills  . lisinopril (ZESTRIL) 10 MG tablet [Pharmacy Med Name: LISINOPRIL 10MG  TABLETS] 30 tablet     Sig: TAKE 1 TABLET(10 MG) BY MOUTH DAILY     Cardiovascular:  ACE Inhibitors Failed - 11/23/2021  3:39 AM      Failed - Cr in normal range and within 180 days    Creatinine, Ser  Date Value Ref Range Status  05/19/2021 0.93 0.76 - 1.27 mg/dL Final         Failed - K in normal range and within 180 days    Potassium  Date Value Ref Range Status  05/19/2021 4.0 3.5 - 5.2 mmol/L Final         Passed - Patient is not pregnant      Passed - Last BP in normal range    BP Readings from Last 1 Encounters:  10/20/21 128/82         Passed - Valid encounter within last 6 months    Recent Outpatient Visits          1 month ago Tobacco abuse   [place] [facility] at [place], Earley Abide, MD   4 months ago Essential hypertension   [place] [facility] at [place], Earley Abide, MD   5 months ago Essential hypertension    Primary Care and Sports Medicine at [place], Earley Abide, MD   6 months ago Annual physical exam   [facility] Primary Care and Sports Medicine at [facility], Earley Abide, MD      Future Appointments            In 7 months Zigmund Daniel, Earley Abide, MD [place] and Sports Medicine at [facility], [facility]

## 2022-06-21 ENCOUNTER — Ambulatory Visit: Payer: BC Managed Care – PPO | Admitting: Family Medicine

## 2022-06-21 ENCOUNTER — Encounter: Payer: Self-pay | Admitting: Family Medicine

## 2022-06-21 VITALS — BP 128/88 | HR 74 | Ht 69.0 in | Wt 146.0 lb

## 2022-06-21 DIAGNOSIS — L659 Nonscarring hair loss, unspecified: Secondary | ICD-10-CM

## 2022-06-21 DIAGNOSIS — E559 Vitamin D deficiency, unspecified: Secondary | ICD-10-CM

## 2022-06-21 DIAGNOSIS — I10 Essential (primary) hypertension: Secondary | ICD-10-CM | POA: Diagnosis not present

## 2022-06-21 DIAGNOSIS — Z0000 Encounter for general adult medical examination without abnormal findings: Secondary | ICD-10-CM

## 2022-06-21 DIAGNOSIS — Z1283 Encounter for screening for malignant neoplasm of skin: Secondary | ICD-10-CM

## 2022-06-21 DIAGNOSIS — Z91030 Bee allergy status: Secondary | ICD-10-CM

## 2022-06-21 DIAGNOSIS — E782 Mixed hyperlipidemia: Secondary | ICD-10-CM | POA: Diagnosis not present

## 2022-06-21 DIAGNOSIS — Z720 Tobacco use: Secondary | ICD-10-CM

## 2022-06-21 MED ORDER — EPINEPHRINE 0.3 MG/0.3ML IJ SOAJ: 0.3000 mg | INTRAMUSCULAR | 4 refills | Status: AC | PRN

## 2022-06-21 MED ORDER — FINASTERIDE 5 MG PO TABS: 5.0000 mg | ORAL_TABLET | Freq: Every day | ORAL | 2 refills | Status: DC

## 2022-06-21 MED ORDER — LISINOPRIL 10 MG PO TABS: ORAL_TABLET | ORAL | 2 refills | Status: DC

## 2022-06-21 NOTE — Assessment & Plan Note (Signed)
Well-controlled on current regimen. ?

## 2022-06-21 NOTE — Progress Notes (Signed)
Annual Physical Exam Visit  Patient Information:  Patient ID: Rodney Mccoy, male DOB: 11-Nov-1980 Age: 42 y.o. MRN: [id number]   Subjective:   CC: Annual Physical Exam  HPI:  Rodney Mccoy is here for their annual physical.  I reviewed the past medical history, family history, social history, surgical history, and allergies today and changes were made as necessary.  Please see the problem list section below for additional details.  Past Medical History: Past Medical History:  Diagnosis Date   Hair loss 2009   Hypertension    Past Surgical History: Past Surgical History:  Procedure Laterality Date   FINGER SURGERY Bilateral    x 3, 2003 (right 3rd), 2006 (left 4th), 2019 (right 4th)   Family History: Family History  Problem Relation Age of Onset   Hypertension Mother    Cancer Maternal Grandfather        prostate cancer (dx 74)   Heart disease Maternal Grandfather        required double bypass (dx 62)   Cancer Paternal Grandmother        GI cancer (dx 34s)   Cancer Paternal Grandfather        GI and skin cancer (melanoma) (dx early-mid 26s)   Allergies: Allergies  Allergen Reactions   Bee Venom Anaphylaxis   No Allergies On File    Health Maintenance: Health Maintenance  Topic Date Due   INFLUENZA VACCINE  09/30/2022   DTaP/Tdap/Td (2 - Td or Tdap) 12/28/2027   Hepatitis C Screening  Completed   HIV Screening  Completed   HPV VACCINES  Aged Out   COVID-19 Vaccine  Discontinued    HM Colonoscopy     This patient has no relevant Health Maintenance data.      Medications: Current Outpatient Medications on File Prior to Visit  Medication Sig Dispense Refill   fexofenadine (ALLEGRA) 180 MG tablet Take 180 mg by mouth daily.     No current facility-administered medications on file prior to visit.    Review of Systems: No headache, visual changes, nausea, vomiting, diarrhea, constipation, dizziness, abdominal pain, skin rash, fevers, chills, night  sweats, swollen lymph nodes, weight loss, chest pain, body aches, joint swelling, muscle aches, shortness of breath, mood changes, visual or auditory hallucinations reported.  Objective:   Vitals:   06/21/22 0805  BP: 128/88  Pulse: 74  SpO2: 98%   Vitals:   06/21/22 0805  Weight: 146 lb (66.2 kg)  Height:  (1.753 m)   Body mass index is 21.56 kg/m.  General: Well Developed, well nourished, and in no acute distress.  Neuro: Alert and oriented x3, extra-ocular muscles intact, sensation grossly intact. Cranial nerves II through XII are grossly intact, motor, sensory, and coordinative functions are intact. HEENT: Normocephalic, atraumatic, pupils equal round reactive to light, neck supple, no masses, no lymphadenopathy, thyroid nonpalpable. Oropharynx, nasopharynx, external ear canals are unremarkable. Skin: Warm and dry, no rashes noted.  Cardiac: Regular rate and rhythm, no murmurs rubs or gallops. No peripheral edema. Pulses symmetric. Respiratory: Clear to auscultation bilaterally. Not using accessory muscles, speaking in full sentences.  Abdominal: Soft, nontender, nondistended, positive bowel sounds, no masses, no organomegaly. Musculoskeletal: Shoulder, elbow, wrist, hip, knee, ankle stable, and with full range of motion.   Impression and Recommendations:   The patient was counselled, risk factors were discussed, and anticipatory guidance given.  Problem List Items Addressed This Visit       Cardiovascular and Mediastinum   Essential hypertension  Well-controlled on current regimen.      Relevant Medications   EPINEPHrine 0.3 mg/0.3 mL IJ SOAJ injection   lisinopril (ZESTRIL) 10 MG tablet   Other Relevant Orders   Comprehensive metabolic panel   Lipid panel   TSH     Musculoskeletal and Integument   Alopecia   Relevant Medications   finasteride (PROSCAR) 5 MG tablet   Other Relevant Orders   Ambulatory referral to Dermatology     Other   Mixed  hyperlipidemia   Relevant Medications   EPINEPHrine 0.3 mg/0.3 mL IJ SOAJ injection   lisinopril (ZESTRIL) 10 MG tablet   Other Relevant Orders   Comprehensive metabolic panel   Lipid panel   Tobacco abuse    Contemplation stage, wants to line up with his wife when they are both ready to quit.  Discussed bupropion utilization, will contact us if wanting to start.      Other Visit Diagnoses     Healthcare maintenance    -  Primary   Relevant Orders   Comprehensive metabolic panel   Lipid panel   TSH   VITAMIN D 25 Hydroxy (Vit-D Deficiency, Fractures)   Vitamin D deficiency       Relevant Orders   VITAMIN D 25 Hydroxy (Vit-D Deficiency, Fractures)   Skin cancer screening       Relevant Orders   Ambulatory referral to Dermatology   Allergy to honey bee venom       Relevant Medications   EPINEPHrine 0.3 mg/0.3 mL IJ SOAJ injection        Orders & Medications Medications:  Meds ordered this encounter  Medications   DISCONTD: finasteride (PROSCAR) 5 MG tablet    Sig: Take 1 tablet (5 mg total) by mouth daily.    Dispense:  90 tablet    Refill:  2   DISCONTD: lisinopril (ZESTRIL) 10 MG tablet    Sig: TAKE 1 TABLET(10 MG) BY MOUTH DAILY    Dispense:  90 tablet    Refill:  2   EPINEPHrine 0.3 mg/0.3 mL IJ SOAJ injection    Sig: Inject 0.3 mg into the muscle as needed for anaphylaxis.    Dispense:  1 each    Refill:  4   lisinopril (ZESTRIL) 10 MG tablet    Sig: TAKE 1 TABLET(10 MG) BY MOUTH DAILY    Dispense:  90 tablet    Refill:  2   finasteride (PROSCAR) 5 MG tablet    Sig: Take 1 tablet (5 mg total) by mouth daily.    Dispense:  90 tablet    Refill:  2   Orders Placed This Encounter  Procedures   Comprehensive metabolic panel   Lipid panel   TSH   VITAMIN D 25 Hydroxy (Vit-D Deficiency, Fractures)   Ambulatory referral to Dermatology     Return in about 1 year (around 06/21/2023) for CPE.    Jerrol Banana, MD, [facility]   Primary Care Sports  Medicine Primary Care and Sports Medicine at [facility]

## 2022-06-21 NOTE — Assessment & Plan Note (Signed)
Contemplation stage, wants to line up with his wife when they are both ready to quit.  Discussed bupropion utilization, will contact us if wanting to start.

## 2022-06-21 NOTE — Patient Instructions (Addendum)
-   Obtain fasting labs with orders provided (can have water or black coffee but otherwise no food or drink x 8 hours before labs) - Review information provided - Attend eye doctor annually, dentist every 6 months, work towards or maintain 30 minutes of moderate intensity physical activity at least 5 days per week, and consume a balanced diet - Return in 1 year for physical - Contact us for any questions between now and then  Additionally: - Can look into bupropion for tobacco cessation-if wanting to start

## 2022-06-28 DIAGNOSIS — E782 Mixed hyperlipidemia: Secondary | ICD-10-CM | POA: Diagnosis not present

## 2022-06-28 DIAGNOSIS — I10 Essential (primary) hypertension: Secondary | ICD-10-CM | POA: Diagnosis not present

## 2022-06-28 DIAGNOSIS — E559 Vitamin D deficiency, unspecified: Secondary | ICD-10-CM | POA: Diagnosis not present

## 2022-06-29 ENCOUNTER — Other Ambulatory Visit: Payer: Self-pay | Admitting: Family Medicine

## 2022-06-29 LAB — LIPID PANEL
Chol/HDL Ratio: 2.6 ratio (ref 0.0–5.0)
Cholesterol, Total: 202 mg/dL — ABNORMAL HIGH (ref 100–199)
HDL: 77 mg/dL (ref >39)
LDL Chol Calc (NIH): 115 mg/dL — ABNORMAL HIGH (ref 0–99)
Triglycerides: 55 mg/dL (ref 0–149)
VLDL Cholesterol Cal: 10 mg/dL (ref 5–40)

## 2022-06-29 LAB — COMPREHENSIVE METABOLIC PANEL
ALT: 16 IU/L (ref 0–44)
AST: 16 IU/L (ref 0–40)
Albumin/Globulin Ratio: 2.5 — ABNORMAL HIGH (ref 1.2–2.2)
Albumin: 4.7 g/dL (ref 4.1–5.1)
Alkaline Phosphatase: 77 IU/L (ref 44–121)
BUN/Creatinine Ratio: 16 (ref 9–20)
BUN: 13 mg/dL (ref 6–24)
Bilirubin Total: 0.7 mg/dL (ref 0.0–1.2)
CO2: 23 mmol/L (ref 20–29)
Calcium: 9.5 mg/dL (ref 8.7–10.2)
Chloride: 101 mmol/L (ref 96–106)
Creatinine, Ser: 0.82 mg/dL (ref 0.76–1.27)
Globulin, Total: 1.9 g/dL (ref 1.5–4.5)
Glucose: 86 mg/dL (ref 70–99)
Potassium: 4.8 mmol/L (ref 3.5–5.2)
Sodium: 138 mmol/L (ref 134–144)
Total Protein: 6.6 g/dL (ref 6.0–8.5)
eGFR: 114 mL/min/{1.73_m2} (ref >59)

## 2022-06-29 LAB — TSH: TSH: 1.17 u[IU]/mL (ref 0.450–4.500)

## 2022-06-29 LAB — VITAMIN D 25 HYDROXY (VIT D DEFICIENCY, FRACTURES): Vit D, 25-Hydroxy: 24.7 ng/mL — ABNORMAL LOW (ref 30.0–100.0)

## 2022-06-29 MED ORDER — VITAMIN D (ERGOCALCIFEROL) 1.25 MG (50000 UNIT) PO CAPS: 50000.0000 [IU] | ORAL_CAPSULE | ORAL | 0 refills | Status: DC

## 2022-12-28 DIAGNOSIS — L570 Actinic keratosis: Secondary | ICD-10-CM | POA: Diagnosis not present

## 2022-12-28 DIAGNOSIS — D229 Melanocytic nevi, unspecified: Secondary | ICD-10-CM | POA: Diagnosis not present

## 2022-12-28 DIAGNOSIS — L821 Other seborrheic keratosis: Secondary | ICD-10-CM | POA: Diagnosis not present

## 2022-12-28 HISTORY — PX: OTHER SURGICAL HISTORY: SHX169

## 2023-06-23 ENCOUNTER — Ambulatory Visit (INDEPENDENT_AMBULATORY_CARE_PROVIDER_SITE_OTHER): Payer: Self-pay | Admitting: Family Medicine

## 2023-06-23 ENCOUNTER — Encounter: Payer: Self-pay | Admitting: Family Medicine

## 2023-06-23 VITALS — BP 136/82 | HR 74 | Ht 69.0 in | Wt 143.0 lb

## 2023-06-23 DIAGNOSIS — L659 Nonscarring hair loss, unspecified: Secondary | ICD-10-CM

## 2023-06-23 DIAGNOSIS — L570 Actinic keratosis: Secondary | ICD-10-CM

## 2023-06-23 DIAGNOSIS — N400 Enlarged prostate without lower urinary tract symptoms: Secondary | ICD-10-CM

## 2023-06-23 DIAGNOSIS — E782 Mixed hyperlipidemia: Secondary | ICD-10-CM | POA: Diagnosis not present

## 2023-06-23 DIAGNOSIS — K219 Gastro-esophageal reflux disease without esophagitis: Secondary | ICD-10-CM

## 2023-06-23 DIAGNOSIS — R7309 Other abnormal glucose: Secondary | ICD-10-CM

## 2023-06-23 DIAGNOSIS — I10 Essential (primary) hypertension: Secondary | ICD-10-CM | POA: Diagnosis not present

## 2023-06-23 DIAGNOSIS — Z0000 Encounter for general adult medical examination without abnormal findings: Secondary | ICD-10-CM

## 2023-06-23 DIAGNOSIS — B351 Tinea unguium: Secondary | ICD-10-CM

## 2023-06-23 NOTE — Assessment & Plan Note (Signed)
 He takes lisinopril  10 mg in the morning for hypertension. His blood pressure has improved after reducing alcohol intake, with current readings in the 130s/80s range. He previously experienced elevated blood pressure at 140/100 mmHg, prompting a reduction in whiskey consumption.  Hypertension Blood pressure controlled with lisinopril  10 mg daily. Discussed smoking cessation's impact on blood pressure. - Continue lisinopril  10 mg daily.

## 2023-06-23 NOTE — Assessment & Plan Note (Signed)
 He is on finasteride  and reports satisfactory results with its use.

## 2023-06-23 NOTE — Assessment & Plan Note (Signed)
 Annual examination completed, risk stratification labs ordered, anticipatory guidance provided.  We will follow labs once resulted.

## 2023-06-23 NOTE — Patient Instructions (Addendum)
-   Obtain fasting labs with orders provided (can have water or black coffee but otherwise no food or drink x 8 hours before labs) - Review information provided - Attend eye doctor annually, dentist every 6 months, work towards or maintain 30 minutes of moderate intensity physical activity at least 5 days per week, and consume a balanced diet - Return in 1 year for physical - Contact us  for any questions between now and then   Patient Plan for Post-Visit Guidance  1. Skin Health:    - Schedule and maintain annual skin check appointments with your dermatologist.  2. Hypertension Management:    - Continue taking lisinopril  10 mg every morning.    - Consider quitting smoking to help further improve blood pressure.  3. Smoking Cessation:    - You smoke about half a pack of cigarettes per day. If interested in quitting, consider using bupropion. Contact our office if you wish to start this medication.  4. General Health:    - Your annual examination is complete, and lab tests have been ordered. We will contact you with the results.  Red Flags: - Seek medical attention if you experience severe headaches, dizziness, or chest pain, as these may be related to blood pressure issues.  Please follow these steps and reach out if you have questions or need further assistance.  Pneumococcal Polysaccharide Vaccine (PPSV23) Injection What is this medication? PNEUMOCOCCAL POLYSACCHARIDE VACCINE (NEU mo KOK al pol ee SAK ar ide vak SEEN) reduces the risk of pneumococcal disease, such as pneumonia. It does not treat pneumococcal disease. It is still possible to get pneumococcal disease after receiving this vaccine, but the symptoms may be less severe or not last as long. It works by helping your immune system learn how to fight off a future infection. This medicine may be used for other purposes; ask your health care provider or pharmacist if you have questions. COMMON BRAND NAME(S): Pneumovax 23,  Pneumovax-23 Bupropion for Smoking Cessation  What is this medication?  BUPROPION (byoo PRO pee on) is a prescription medication used to help people quit smoking. It works by reducing nicotine cravings and withdrawal symptoms. Bupropion affects chemicals in the brain that are linked to nicotine addiction and mood regulation. It does not contain nicotine and can be used alone or in combination with other quit-smoking strategies like nicotine replacement therapy (NRT).  This medicine may be used for other purposes; ask your health care provider or pharmacist if you have questions.  COMMON BRAND NAME(S): Zyban, Wellbutrin SR/XL (when used off-label)

## 2023-06-23 NOTE — Assessment & Plan Note (Addendum)
 He smokes about half a pack of cigarettes per day.  Tobacco use Smokes half a pack per day. Discussed nicotine's effects and bupropion's role in cessation. - Consider bupropion for smoking cessation if interested.  Contact our office for Rx.

## 2023-06-23 NOTE — Progress Notes (Signed)
 Annual Physical Exam Visit  Patient Information:  Patient ID: Rodney Mccoy, male DOB: 1980-09-28 Age: 43 y.o. MRN: [id number]   Subjective:   CC: Annual Physical Exam  HPI:  Rodney Mccoy is here for their annual physical.  I reviewed the past medical history, family history, social history, surgical history, and allergies today and changes were made as necessary.  Please see the problem list section below for additional details.  Past Medical History: Past Medical History:  Diagnosis Date   Hair loss 2009   Hypertension    Past Surgical History: Past Surgical History:  Procedure Laterality Date   FINGER SURGERY Bilateral    x 3, 2003 (right 3rd), 2006 (left 4th), 2019 (right 4th)   skin lesion removal  12/28/2022   left forearm, cryo (no bx)   Family History: Family History  Problem Relation Age of Onset   Hypertension Mother    Cancer Maternal Grandfather        prostate cancer (dx 82) passed 76   Heart disease Maternal Grandfather        required double bypass (dx 56)   Cancer Paternal Grandmother        GI cancer (dx 45s)   Cancer Paternal Grandfather        GI and skin cancer (melanoma) (dx early-mid 77s)   Allergies: Allergies  Allergen Reactions   Bee Venom Anaphylaxis   No Allergies On File    Health Maintenance: Health Maintenance  Topic Date Due   Pneumococcal Vaccine 22-22 Years old (1 of 2 - PCV) Never done   INFLUENZA VACCINE  09/30/2023   DTaP/Tdap/Td (2 - Td or Tdap) 12/28/2027   Hepatitis C Screening  Completed   HIV Screening  Completed   HPV VACCINES  Aged Out   Meningococcal B Vaccine  Aged Out   COVID-19 Vaccine  Discontinued    HM Colonoscopy   This patient has no relevant Health Maintenance data.    Medications: Current Outpatient Medications on File Prior to Visit  Medication Sig Dispense Refill   EPINEPHrine  0.3 mg/0.3 mL IJ SOAJ injection Inject 0.3 mg into the muscle as needed for anaphylaxis. 1 each 4   fexofenadine  (ALLEGRA) 180 MG tablet Take 180 mg by mouth daily.     finasteride  (PROSCAR ) 5 MG tablet Take 1 tablet (5 mg total) by mouth daily. 90 tablet 2   lisinopril  (ZESTRIL ) 10 MG tablet TAKE 1 TABLET(10 MG) BY MOUTH DAILY 90 tablet 2   No current facility-administered medications on file prior to visit.    Objective:   Vitals:   06/23/23 0754  BP: 136/82  Pulse: 74  SpO2: 97%   Vitals:   06/23/23 0754  Weight: 143 lb (64.9 kg)  Height: 5\' 9"  (1.753 m)   Body mass index is 21.12 kg/m.  General: Well Developed, well nourished, and in no acute distress.  Neuro: Alert and oriented x3, extra-ocular muscles intact, sensation grossly intact. Cranial nerves II through XII are grossly intact, motor, sensory, and coordinative functions are intact. HEENT: Normocephalic, atraumatic, neck supple, no masses, no lymphadenopathy, thyroid  nonenlarged. Oropharynx, nasopharynx, external ear canals are unremarkable. Skin: Warm and dry, no rashes noted.  Cardiac: Regular rate and rhythm, no murmurs rubs or gallops. No peripheral edema. Pulses symmetric. Respiratory: Clear to auscultation bilaterally. Speaking in full sentences.  Abdominal: Soft, nontender, nondistended, positive bowel sounds, no masses, no organomegaly. Musculoskeletal: Stable, and with full range of motion.  Impression and Recommendations:   The  patient was counselled, risk factors were discussed, and anticipatory guidance given.  Problem List Items Addressed This Visit     Actinic keratoses   He has a history of actinic keratoses on his left forearm, which was treated with cryotherapy by dermatology in October 2024.   - Maintain annual skin check with dermatologist       Alopecia   He is on finasteride  and reports satisfactory results with its use.      Esophageal reflux   He manages gastroesophageal reflux disease with over-the-counter medications as needed. His symptoms are currently well-controlled and not bothersome.       Essential hypertension   He takes lisinopril  10 mg in the morning for hypertension. His blood pressure has improved after reducing alcohol intake, with current readings in the 130s/80s range. He previously experienced elevated blood pressure at 140/100 mmHg, prompting a reduction in whiskey consumption.  Hypertension Blood pressure controlled with lisinopril  10 mg daily. Discussed smoking cessation's impact on blood pressure. - Continue lisinopril  10 mg daily.      Relevant Orders   Comprehensive metabolic panel with GFR   Lipid panel   Healthcare maintenance - Primary   Annual examination completed, risk stratification labs ordered, anticipatory guidance provided.  We will follow labs once resulted.      Relevant Orders   CBC   Mixed hyperlipidemia   Relevant Orders   Comprehensive metabolic panel with GFR   Lipid panel   Onychomycosis   He smokes about half a pack of cigarettes per day.  Tobacco use Smokes half a pack per day. Discussed nicotine's effects and bupropion's role in cessation. - Consider bupropion for smoking cessation if interested.  Contact our office for Rx.      Other Visit Diagnoses       Abnormal glucose       Relevant Orders   Hemoglobin A1c     Benign prostatic hyperplasia, unspecified whether lower urinary tract symptoms present       Relevant Orders   PSA Total (Reflex To Free)        Orders & Medications Medications: No orders of the defined types were placed in this encounter.  Orders Placed This Encounter  Procedures   CBC   Comprehensive metabolic panel with GFR   Hemoglobin A1c   Lipid panel   PSA Total (Reflex To Free)     Return in about 1 year (around 06/22/2024) for CPE.    Ma Saupe, MD, [facility]   Primary Care Sports Medicine Primary Care and Sports Medicine at [facility]

## 2023-06-23 NOTE — Assessment & Plan Note (Signed)
 He has a history of actinic keratoses on his left forearm, which was treated with cryotherapy by dermatology in October 2024.   - Maintain annual skin check with dermatologist

## 2023-06-23 NOTE — Assessment & Plan Note (Signed)
 He manages gastroesophageal reflux disease with over-the-counter medications as needed. His symptoms are currently well-controlled and not bothersome.

## 2023-06-24 ENCOUNTER — Encounter: Payer: Self-pay | Admitting: Family Medicine

## 2023-06-24 LAB — COMPREHENSIVE METABOLIC PANEL WITH GFR
ALT: 16 IU/L (ref 0–44)
AST: 16 IU/L (ref 0–40)
Albumin: 4.3 g/dL (ref 4.1–5.1)
Alkaline Phosphatase: 74 IU/L (ref 44–121)
BUN/Creatinine Ratio: 19 (ref 9–20)
BUN: 15 mg/dL (ref 6–24)
Bilirubin Total: 0.6 mg/dL (ref 0.0–1.2)
CO2: 25 mmol/L (ref 20–29)
Calcium: 9.4 mg/dL (ref 8.7–10.2)
Chloride: 105 mmol/L (ref 96–106)
Creatinine, Ser: 0.77 mg/dL (ref 0.76–1.27)
Globulin, Total: 1.9 g/dL (ref 1.5–4.5)
Glucose: 82 mg/dL (ref 70–99)
Potassium: 4.9 mmol/L (ref 3.5–5.2)
Sodium: 141 mmol/L (ref 134–144)
Total Protein: 6.2 g/dL (ref 6.0–8.5)
eGFR: 115 mL/min/{1.73_m2} (ref >59)

## 2023-06-24 LAB — LIPID PANEL
Chol/HDL Ratio: 2.9 ratio (ref 0.0–5.0)
Cholesterol, Total: 182 mg/dL (ref 100–199)
HDL: 62 mg/dL (ref >39)
LDL Chol Calc (NIH): 108 mg/dL — ABNORMAL HIGH (ref 0–99)
Triglycerides: 66 mg/dL (ref 0–149)
VLDL Cholesterol Cal: 12 mg/dL (ref 5–40)

## 2023-06-24 LAB — CBC
Hematocrit: 44.9 % (ref 37.5–51.0)
Hemoglobin: 14.9 g/dL (ref 13.0–17.7)
MCH: 31.6 pg (ref 26.6–33.0)
MCHC: 33.2 g/dL (ref 31.5–35.7)
MCV: 95 fL (ref 79–97)
Platelets: 181 10*3/uL (ref 150–450)
RBC: 4.71 x10E6/uL (ref 4.14–5.80)
RDW: 12 % (ref 11.6–15.4)
WBC: 7.3 10*3/uL (ref 3.4–10.8)

## 2023-06-24 LAB — PSA TOTAL (REFLEX TO FREE): Prostate Specific Ag, Serum: 0.6 ng/mL (ref 0.0–4.0)

## 2023-06-24 LAB — HEMOGLOBIN A1C
Est. average glucose Bld gHb Est-mCnc: 108 mg/dL
Hgb A1c MFr Bld: 5.4 % (ref 4.8–5.6)

## 2023-07-08 ENCOUNTER — Other Ambulatory Visit: Payer: Self-pay

## 2023-07-08 ENCOUNTER — Other Ambulatory Visit: Payer: Self-pay | Admitting: Family Medicine

## 2023-07-08 DIAGNOSIS — I10 Essential (primary) hypertension: Secondary | ICD-10-CM

## 2023-07-08 MED ORDER — LISINOPRIL 10 MG PO TABS: ORAL_TABLET | ORAL | 2 refills | Status: AC

## 2023-07-11 NOTE — Telephone Encounter (Signed)
 Requested Prescriptions  Refused Prescriptions Disp Refills   lisinopril  (ZESTRIL ) 10 MG tablet [Pharmacy Med Name: LISINOPRIL  10MG  TABLETS] 90 tablet 2    Sig: TAKE 1 TABLET(10 MG) BY MOUTH DAILY     Cardiovascular:  ACE Inhibitors Passed - 07/11/2023 12:43 PM      Passed - Cr in normal range and within 180 days    Creatinine, Ser  Date Value Ref Range Status  06/23/2023 0.77 0.76 - 1.27 mg/dL Final         Passed - K in normal range and within 180 days    Potassium  Date Value Ref Range Status  06/23/2023 4.9 3.5 - 5.2 mmol/L Final         Passed - Patient is not pregnant      Passed - Last BP in normal range    BP Readings from Last 1 Encounters:  06/23/23 136/82         Passed - Valid encounter within last 6 months    Recent Outpatient Visits           2 weeks ago Healthcare maintenance   [facility] at [facility], Dessie Flow, MD       Future Appointments             In 11 months Augustus Ledger, Dessie Flow, MD [facility] Primary Care & Sports Medicine at [facility], [facility]

## 2023-07-17 ENCOUNTER — Other Ambulatory Visit: Payer: Self-pay | Admitting: Family Medicine

## 2023-07-17 DIAGNOSIS — L659 Nonscarring hair loss, unspecified: Secondary | ICD-10-CM

## 2023-07-20 NOTE — Telephone Encounter (Signed)
 Requested medication (s) are due for refill today: medication date expired  Requested medication (s) are on the active medication list: yes   Last refill:  06/21/22 #90 2 refills  Future visit scheduled: yes in 11 months  Notes to clinic:  medication date expired. Do you want to renew Rx ?     Requested Prescriptions  Pending Prescriptions Disp Refills   finasteride  (PROSCAR ) 5 MG tablet [Pharmacy Med Name: FINASTERIDE  5MG  TABLETS] 90 tablet 2    Sig: TAKE 1 TABLET(5 MG) BY MOUTH DAILY     Urology: 5-alpha Reductase Inhibitors Passed - 07/20/2023  8:09 AM      Passed - PSA in normal range and within 360 days    Prostate Specific Ag, Serum  Date Value Ref Range Status  06/23/2023 0.6 0.0 - 4.0 ng/mL Final    Comment:    Roche ECLIA methodology. According to the American Urological Association, Serum PSA should decrease and remain at undetectable levels after radical prostatectomy. The AUA defines biochemical recurrence as an initial PSA value 0.2 ng/mL or greater followed by a subsequent confirmatory PSA value 0.2 ng/mL or greater. Values obtained with different assay methods or kits cannot be used interchangeably. Results cannot be interpreted as absolute evidence of the presence or absence of malignant disease.          Passed - Valid encounter within last 12 months    Recent Outpatient Visits           3 weeks ago Healthcare maintenance   [facility] Primary Care & Sports Medicine at [facility], Dessie Flow, MD       Future Appointments             In 11 months Augustus Ledger, Dessie Flow, MD [facility] Primary Care & Sports Medicine at [facility], [facility]

## 2023-11-24 ENCOUNTER — Encounter (INDEPENDENT_AMBULATORY_CARE_PROVIDER_SITE_OTHER): Payer: Self-pay | Admitting: Family Medicine

## 2023-11-24 DIAGNOSIS — W57XXXA Bitten or stung by nonvenomous insect and other nonvenomous arthropods, initial encounter: Secondary | ICD-10-CM | POA: Diagnosis not present

## 2023-11-24 DIAGNOSIS — S40861A Insect bite (nonvenomous) of right upper arm, initial encounter: Secondary | ICD-10-CM | POA: Diagnosis not present

## 2023-11-24 NOTE — Telephone Encounter (Signed)
 Please review

## 2023-11-27 MED ORDER — DOXYCYCLINE HYCLATE 200 MG PO TBEC: 1.0000 | DELAYED_RELEASE_TABLET | Freq: Once | ORAL | 0 refills | Status: AC

## 2023-11-27 NOTE — Telephone Encounter (Signed)
 Please see the MyChart message reply(ies) for my assessment and plan.    This patient gave consent for this Medical Advice Message and is aware that it may result in a bill to [employer], as well as the possibility of receiving a bill for a co-payment or deductible. They are an established patient, but are not seeking medical advice exclusively about a problem treated during an in person or video visit in the last seven days. I did not recommend an in person or video visit within seven days of my reply.    I spent a total of 21 minutes cumulative time within 7 days through [employer].  Jerrol Banana, MD

## 2023-11-28 DIAGNOSIS — W57XXXA Bitten or stung by nonvenomous insect and other nonvenomous arthropods, initial encounter: Secondary | ICD-10-CM | POA: Insufficient documentation

## 2023-12-09 NOTE — Telephone Encounter (Signed)
 FYI. Pharmacy changed.  KP

## 2023-12-09 NOTE — Telephone Encounter (Signed)
 Thank you for changing his pharmacy.

## 2024-06-28 ENCOUNTER — Encounter: Admitting: Family Medicine
